# Patient Record
Sex: Female | Born: 1990 | Race: Black or African American | Hispanic: No | Marital: Single | Smoking: Never smoker
Health system: Southern US, Community
[De-identification: ages and names within clinical notes are randomized; demographics above are authoritative.]

---
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
## 2020-07-12 ENCOUNTER — Emergency Department (HOSPITAL_COMMUNITY)
Admission: EM | Admit: 2020-07-12 | Discharge: 2020-07-13 | Disposition: A | Discharge status: Home / Self Care | Payer: Self-pay | Attending: Emergency Medicine | Admitting: Emergency Medicine

## 2020-07-12 ENCOUNTER — Encounter (HOSPITAL_COMMUNITY): Payer: Self-pay | Admitting: Emergency Medicine

## 2020-07-12 ENCOUNTER — Other Ambulatory Visit: Payer: Self-pay

## 2020-07-12 DIAGNOSIS — Z20822 Contact with and (suspected) exposure to covid-19: Secondary | ICD-10-CM | POA: Insufficient documentation

## 2020-07-12 DIAGNOSIS — J029 Acute pharyngitis, unspecified: Secondary | ICD-10-CM | POA: Insufficient documentation

## 2020-07-12 LAB — GROUP A STREP BY PCR: Group A Strep by PCR: NOT DETECTED

## 2020-07-12 MED ORDER — HYDROCODONE-ACETAMINOPHEN 5-325 MG PO TABS
1.0000 | ORAL_TABLET | Freq: Once | ORAL | Status: AC
  Administered 2020-07-13: 1 via ORAL
  Filled 2020-07-12: qty 1

## 2020-07-12 MED ORDER — DEXAMETHASONE 4 MG PO TABS
10.0000 mg | ORAL_TABLET | Freq: Once | ORAL | Status: AC
  Administered 2020-07-13: 10 mg via ORAL
  Filled 2020-07-12: qty 2

## 2020-07-12 NOTE — ED Provider Notes (Signed)
Emergency Medicine Provider Triage Evaluation Note  Kim Gallegos , a 30 y.o. female  was evaluated in triage.  Pt complains of sore throat.  Review of Systems  Positive: Sore throat, difficulty swallowing, fatigue, congestion Negative: Fever, headache, loss of taste or smell, cough  Physical Exam  BP (!) 198/129 (BP Location: Left Arm)   Pulse (!) 110   Temp 98.3 F (36.8 C) (Oral)   Resp 20   Ht 5\' 8"  (1.727 m)   Wt 136.1 kg   SpO2 94%   BMI 45.61 kg/m  Gen:   Awake, no distress   Resp:  Normal effort  MSK:   Moves extremities without difficulty  Other:  Uvula midline, mild postoralpharyngeal erythema, no abscess  Medical Decision Making  Medically screening exam initiated at 9:37 PM.  Appropriate orders placed.  Kim Gallegos was informed that the remainder of the evaluation will be completed by another provider, this initial triage assessment does not replace that evaluation, and the importance of remaining in the ED until their evaluation is complete.  Sore throat x 3 days, fatigue, difficulty swallowing.  Not covid vaccinated.    Kim Roosevelt, PA-C 07/12/20 2139    2140, MD 07/12/20 2246

## 2020-07-12 NOTE — ED Provider Notes (Signed)
[facility]-EMERGENCY DEPT Provider Note   CSN: [id number] Arrival date & time: 07/12/20  2124     History Chief Complaint  Patient presents with  . Oral Swelling    Kim Gallegos is a 30 y.o. female.  Patient to ED with sore throat that has become progressively worse over the last 3 days. No fever. She has some very mild congestion, no cough. She is able to eat and drink. She has been gargling with salt water, having cold drinks, taking honey all without relief. No vomiting. No known sick contacts.   The history is provided by the patient. No language interpreter was used.       History reviewed. No pertinent past medical history.  There are no problems to display for this patient.   History reviewed. No pertinent surgical history.   OB History   No obstetric history on file.     History reviewed. No pertinent family history.  Social History   Tobacco Use  . Smoking status: Never Smoker  . Smokeless tobacco: Never Used  Vaping Use  . Vaping Use: Never used  Substance Use Topics  . Alcohol use: Never  . Drug use: Never    Home Medications Prior to Admission medications   Not on File    Allergies    Patient has no known allergies.  Review of Systems   Review of Systems  Constitutional: Negative for chills and fever.  HENT: Positive for sore throat. Negative for congestion and trouble swallowing.   Respiratory: Negative.  Negative for cough.   Cardiovascular: Negative.   Gastrointestinal: Negative.  Negative for nausea.  Musculoskeletal: Negative.  Negative for myalgias.  Skin: Negative.   Neurological: Negative.     Physical Exam Updated Vital Signs BP (!) 172/102 (BP Location: Left Wrist)   Pulse 96   Temp 99.2 F (37.3 C) (Oral)   Resp 17   Ht 5\' 8"  (1.727 m)   Wt 136.1 kg   SpO2 95%   BMI 45.61 kg/m   Physical Exam Vitals and nursing note reviewed.  Constitutional:      Appearance: She is well-developed.   HENT:     Head: Normocephalic.     Mouth/Throat:     Mouth: Mucous membranes are moist.     Pharynx: Posterior oropharyngeal erythema present. No oropharyngeal exudate.  Cardiovascular:     Rate and Rhythm: Normal rate and regular rhythm.     Heart sounds: No murmur heard.   Pulmonary:     Effort: Pulmonary effort is normal.     Breath sounds: Normal breath sounds. No wheezing, rhonchi or rales.  Abdominal:     General: Bowel sounds are normal.     Palpations: Abdomen is soft.     Tenderness: There is no abdominal tenderness. There is no guarding or rebound.  Musculoskeletal:        General: Normal range of motion.     Cervical back: Normal range of motion and neck supple.  Skin:    General: Skin is warm and dry.     Findings: No rash.  Neurological:     Mental Status: She is alert and oriented to person, place, and time.     ED Results / Procedures / Treatments   Labs (all labs ordered are listed, but only abnormal results are displayed) Labs Reviewed  GROUP A STREP BY PCR  SARS CORONAVIRUS 2 (TAT 6-24 HRS)   Results for orders placed or performed during the hospital encounter  of 07/12/20  Group A Strep by PCR   Specimen: Nasopharyngeal Swab; Sterile Swab  Result Value Ref Range   Group A Strep by PCR NOT DETECTED NOT DETECTED    EKG None  Radiology No results found.  Procedures Procedures   Medications Ordered in ED Medications - No data to display  ED Course  I have reviewed the triage vital signs and the nursing notes.  Pertinent labs & imaging results that were available during my care of the patient were reviewed by me and considered in my medical decision making (see chart for details).    MDM Rules/Calculators/A&P                          Sore throat without other symptoms x 3 days.   Strep negative. COVID/flu pending (send out). Discussed sore throat relief at home. Will dose Decadron. REcommend PCP f/u for persistent symptoms.   Patient  is hypertensive. She reports she has not taken her night time dose of medications, which she will do when she gets home.   Final Clinical Impression(s) / ED Diagnoses Final diagnoses:  None   1. Pharyngitis   Rx / DC Orders ED Discharge Orders    None       Danne Harbor 07/13/20 0009    Palumbo, April, MD 07/13/20 [id number]

## 2020-07-12 NOTE — ED Triage Notes (Signed)
Patient complaining of sore throat since Tuesday. Patient states that it feels like her throat is closing up. Patient also states she had a runny nose and was dissolved of on Wednesday.

## 2020-07-13 LAB — SARS CORONAVIRUS 2 (TAT 6-24 HRS): SARS Coronavirus 2: NEGATIVE

## 2020-07-13 NOTE — Discharge Instructions (Addendum)
Your COVID/influenza test will result in 6-24 hours and can be obtained through MyChart.   Continue sore throat care at home. REturn to the ED if symptoms worsen.
